# Patient Record
Sex: Male | Born: 1995 | Race: White | Hispanic: No | Marital: Single | Smoking: Current every day smoker
Health system: Southern US, Community
[De-identification: ages and names within clinical notes are randomized; demographics above are authoritative.]

## PROBLEM LIST (undated history)

## (undated) HISTORY — PX: TONSILLECTOMY: SUR1361

## (undated) HISTORY — PX: WISDOM TOOTH EXTRACTION: SHX21

---

## 2018-01-18 ENCOUNTER — Other Ambulatory Visit: Payer: Self-pay | Admitting: Internal Medicine

## 2018-01-18 ENCOUNTER — Ambulatory Visit
Admission: RE | Admit: 2018-01-18 | Discharge: 2018-01-18 | Disposition: A | Discharge status: Home / Self Care | Payer: BLUE CROSS/BLUE SHIELD | Source: Ambulatory Visit | Attending: Internal Medicine | Admitting: Internal Medicine

## 2018-01-18 DIAGNOSIS — R0789 Other chest pain: Secondary | ICD-10-CM

## 2018-06-07 ENCOUNTER — Ambulatory Visit (INDEPENDENT_AMBULATORY_CARE_PROVIDER_SITE_OTHER): Payer: BLUE CROSS/BLUE SHIELD | Admitting: Internal Medicine

## 2018-06-07 ENCOUNTER — Encounter: Payer: Self-pay | Admitting: Internal Medicine

## 2018-06-07 VITALS — BP 104/62 | HR 104 | Ht 68.0 in | Wt 120.0 lb

## 2018-06-07 DIAGNOSIS — R0609 Other forms of dyspnea: Secondary | ICD-10-CM | POA: Diagnosis not present

## 2018-06-07 NOTE — Progress Notes (Signed)
Jeremy Navarro, male    DOB: [birth year], 22 y.o.   MRN: [id number]   Brief patient profile:  22 yowm active Vaper  grew up in [place]  Started vaping 2017 able to run cross country in middle school thru HS competitively as recently as 2017 and after that no regular ex and noted indolent progressive doe x fall 2018 to point where sob x one flight of steps so referred to pulmonary clinic 06/07/2018 by Dr   Jeremy Navarro for doe    History of Present Illness  06/07/2018  Pulmonary/ 1st office eval/Jeremy Navarro  Chief Complaint  Patient presents with  . Pulmonary Consult    Referred by Dr. Erick Navarro. Pt c/o DOE for the past 6 months. He gets winded walking up a flight of stairs.   Dyspnea: indolent onset/ minimally progressive  Doe = MMRC1 = can walk nl pace, flat grade, can't hurry or go uphills or steps s sob   Cough: none Sleep: sleeps prone, no problem SABA use: none   No obvious day to day or daytime variability or assoc excess/ purulent sputum or mucus plugs or hemoptysis or cp or chest tightness, subjective wheeze or overt sinus or hb symptoms.   Sleeping as above  without nocturnal  or early am exacerbation  of respiratory  c/o's or need for noct saba. Also denies any obvious fluctuation of symptoms with weather or environmental changes or other aggravating or alleviating factors except as outlined above   No unusual exposure hx or h/o childhood pna/ asthma or knowledge of premature birth.  Current Allergies, Complete Past Medical History, Past Surgical History, Family History, and Social History were reviewed in [employer] record.  ROS  The following are not active complaints unless bolded Hoarseness, sore throat, dysphagia, dental problems, itching, sneezing,  nasal congestion or discharge of excess mucus or purulent secretions, ear ache,   fever, chills, sweats, unintended wt loss or wt gain, classically pleuritic or exertional cp,  orthopnea pnd or arm/hand  swelling  or leg swelling, presyncope, palpitations, abdominal pain, anorexia, nausea, vomiting, diarrhea  or change in bowel habits or change in bladder habits, change in stools or change in urine, dysuria, hematuria,  rash, arthralgias, visual complaints, headache, numbness, weakness or ataxia or problems with walking or coordination,  change in mood or  memory.              Objective:     BP 104/62 (BP Location: Left Arm, Cuff Size: Normal)   Pulse (!) 104   Ht 5\' 8"  (1.727 m)   Wt 120 lb (54.4 kg)   SpO2 100%   BMI 18.25 kg/m   SpO2: 100 % RA   amb wm nad/ slt sweaty palms and mild exophthalmos s lid lag   HEENT: nl dentition, turbinates bilaterally, and oropharynx. Nl external ear canals without cough reflex   NECK :  without JVD/Nodes/TM/ nl carotid upstrokes bilaterally   LUNGS: no acc muscle use,  Nl contour chest which is clear to A and P bilaterally without cough on insp or exp maneuvers   CV:  RRR  no s3 or murmur or increase in P2, and no edema   ABD:  soft and nontender with nl inspiratory excursion in the supine position. No bruits or organomegaly appreciated, bowel sounds nl  MS:  Nl gait/ ext warm without deformities, calf tenderness, cyanosis or clubbing No obvious joint restrictions   SKIN: warm and dry without lesions  NEURO:  alert, approp, nl sensorium with  no motor or cerebellar deficits apparent.      I personally reviewed images and agree with radiology impression as follows:  CXR:   01/18/18  Nl exam   Labs  05/26/18 in care everywhere:  HC03/ hgb/ tsh all nl     Assessment   DOE (dyspnea on exertion) Last able to do rigorous aerobics 2017 - Spirometry 06/07/2018  FEV1 2.7 (61%)  Ratio 78 with abn contour to effort dep portion of f/v loop only  - 06/07/2018   Walked RA  2 laps @ 26410ft each @ fast pace  stopped due to end of test/ no sob or desats - - FENO 06/07/2018  =   13    Symptoms are markedly disproportionate to objective  findings and not clear to what extent this is actually a pulmonary  problem but pt does appear to have difficult to sort out respiratory symptoms of unknown origin for which  DDX  = almost all start with A and  include Adherence, Ace Inhibitors, Acid Reflux, Active Sinus Disease, Alpha 1 Antitripsin deficiency, Anxiety masquerading as Airways dz,  ABPA,  Allergy(esp in young), Aspiration (esp in elderly), Adverse effects of meds,  Active smoking or Vaping, A bunch of PE's/clot burden (a few small clots can't cause this syndrome unless there is already severe underlying pulm or vascular dz with poor reserve),  Anemia or thyroid disorder, plus two Bs  = Bronchiectasis and Beta blocker use..and one C= CHF     ? Active smoking/ vaping > strongly rec continue to wean himself off all vapes   ? Anxiety/deconditioning  > usually at the bottom of this list of usual suspects but should be much higher on this pt's based on H and P and  Certainly may interfere with adherence and also interpretation of response or lack thereof to symptom management which can be quite subjective.   ? Allergy/ asthma unlikely with such a low feno and no variability or  cough or noct symtoms or assoc rhinitis but asthma has not been excluded at this point  ? Anemia/ thyroid dz > excluded 05/25/18    >>> rec gradually increase aerobic activity and return in 4 weeks with pfts and then arrange cpst (with spirometry before and after ) if not better and no dx to explain symptoms.     Total time devoted to counseling  > 50 % of initial 60 min office visit:  review case with pt/directly observe walking study/ reviewe spirometry and  discussion of options/alternatives/ personally creating written customized instructions  in presence of pt  then going over those specific  Instructions directly with the pt including how to use all of the meds but in particular covering each new medication in detail and the difference between the maintenance=  "automatic" meds and the prns using an action plan format for the latter (If this problem/symptom => do that organization reading Left to right).  Please see AVS from this visit for a full list of these instructions which I personally wrote for this pt and  are unique to this visit.      Sandrea HughsMichael Annalicia Renfrew, MD 06/07/2018

## 2018-06-07 NOTE — Patient Instructions (Addendum)
To get the most out of exercise, you need to be continuously aware that you are short of breath, but never out of breath, for 30 minutes daily. As you improve, it will actually be easier for you to do the same amount of exercise  in  30 minutes so always push to the level where you are short of breath.     Try to limit / wean all vaping    Please schedule a follow up office visit in 4 weeks, sooner if needed pfts and cxr

## 2018-06-08 ENCOUNTER — Encounter: Payer: Self-pay | Admitting: Internal Medicine

## 2018-06-08 DIAGNOSIS — R0609 Other forms of dyspnea: Principal | ICD-10-CM | POA: Insufficient documentation

## 2018-06-08 LAB — NITRIC OXIDE: Nitric Oxide: 13

## 2018-06-08 NOTE — Assessment & Plan Note (Addendum)
Last able to do rigorous aerobics 2017 - Spirometry 06/07/2018  FEV1 2.7 (61%)  Ratio 78 with abn contour to effort dep portion of f/v loop only  - 06/07/2018   Walked RA  2 laps @ 27910ft each @ fast pace  stopped due to end of test/ no sob or desats - - FENO 06/07/2018  =   13    Symptoms are markedly disproportionate to objective findings and not clear to what extent this is actually a pulmonary  problem but pt does appear to have difficult to sort out respiratory symptoms of unknown origin for which  DDX  = almost all start with A and  include Adherence, Ace Inhibitors, Acid Reflux, Active Sinus Disease, Alpha 1 Antitripsin deficiency, Anxiety masquerading as Airways dz,  ABPA,  Allergy(esp in young), Aspiration (esp in elderly), Adverse effects of meds,  Active smoking or Vaping, A bunch of PE's/clot burden (a few small clots can't cause this syndrome unless there is already severe underlying pulm or vascular dz with poor reserve),  Anemia or thyroid disorder, plus two Bs  = Bronchiectasis and Beta blocker use..and one C= CHF     ? Active smoking/ vaping > strongly rec continue to wean himself off all vapes   ? Anxiety/deconditioning  > usually at the bottom of this list of usual suspects but should be much higher on this pt's based on H and P and  Certainly may interfere with adherence and also interpretation of response or lack thereof to symptom management which can be quite subjective.   ? Allergy/ asthma unlikely with such a low feno and no variability or  cough or noct symtoms or assoc rhinitis but asthma has not been excluded at this point  ? Anemia/ thyroid dz > excluded 05/25/18    >>> rec gradually increase aerobic activity and return in 4 weeks with pfts and then arrange cpst (with spirometry before and after ) if not better and no dx to explain symptoms.     Total time devoted to counseling  > 50 % of initial 60 min office visit:  review case with pt/directly observe walking  study/ reviewe spirometry and  discussion of options/alternatives/ personally creating written customized instructions  in presence of pt  then going over those specific  Instructions directly with the pt including how to use all of the meds but in particular covering each new medication in detail and the difference between the maintenance= "automatic" meds and the prns using an action plan format for the latter (If this problem/symptom => do that organization reading Left to right).  Please see AVS from this visit for a full list of these instructions which I personally wrote for this pt and  are unique to this visit.

## 2018-06-09 ENCOUNTER — Encounter: Payer: Self-pay | Admitting: Internal Medicine

## 2018-07-04 ENCOUNTER — Other Ambulatory Visit: Payer: Self-pay | Admitting: Internal Medicine

## 2018-07-04 DIAGNOSIS — R0609 Other forms of dyspnea: Principal | ICD-10-CM

## 2018-07-05 ENCOUNTER — Ambulatory Visit: Payer: BLUE CROSS/BLUE SHIELD | Admitting: Internal Medicine

## 2018-07-18 ENCOUNTER — Emergency Department (HOSPITAL_BASED_OUTPATIENT_CLINIC_OR_DEPARTMENT_OTHER)
Admission: EM | Admit: 2018-07-18 | Discharge: 2018-07-19 | Disposition: A | Discharge status: Home / Self Care | Payer: BLUE CROSS/BLUE SHIELD | Attending: Emergency Medicine | Admitting: Emergency Medicine

## 2018-07-18 ENCOUNTER — Other Ambulatory Visit: Payer: Self-pay

## 2018-07-18 ENCOUNTER — Encounter (HOSPITAL_BASED_OUTPATIENT_CLINIC_OR_DEPARTMENT_OTHER): Payer: Self-pay | Admitting: *Deleted

## 2018-07-18 DIAGNOSIS — F17290 Nicotine dependence, other tobacco product, uncomplicated: Secondary | ICD-10-CM | POA: Diagnosis not present

## 2018-07-18 DIAGNOSIS — R51 Headache: Secondary | ICD-10-CM | POA: Insufficient documentation

## 2018-07-18 DIAGNOSIS — R519 Headache, unspecified: Secondary | ICD-10-CM

## 2018-07-18 NOTE — ED Triage Notes (Signed)
Headache, nausea and light sensitivity that started earlier today.

## 2018-07-18 NOTE — ED Notes (Signed)
ED Provider at bedside. 

## 2018-07-18 NOTE — ED Notes (Signed)
Headache almost completely resolved after being placed in dark treatment room.

## 2018-07-19 ENCOUNTER — Ambulatory Visit: Payer: BLUE CROSS/BLUE SHIELD | Admitting: Internal Medicine

## 2018-07-19 ENCOUNTER — Emergency Department (HOSPITAL_BASED_OUTPATIENT_CLINIC_OR_DEPARTMENT_OTHER): Payer: BLUE CROSS/BLUE SHIELD

## 2018-07-19 NOTE — ED Notes (Signed)
ED Provider at bedside. 

## 2018-07-19 NOTE — Discharge Instructions (Signed)
Ibuprofen 600 mg every 6 hours as needed if symptoms recur.  Follow-up with your primary doctor if symptoms persist or change.

## 2018-07-19 NOTE — ED Provider Notes (Signed)
[facility] EMERGENCY DEPARTMENT Provider Note   CSN: [id number] Arrival date & time: 07/18/18  2210     History   Chief Complaint Chief Complaint  Patient presents with  . Headache    HPI Jeremy Navarro is a 23 y.o. male.  Patient is a 23 year old male with no significant past medical history.  He presents today with complaints of headache.  This started earlier this evening.  He describes the sudden onset of severe pain to the right temple with associated nausea but no vomiting.  He denies any visual disturbances, numbness, or tingling.  He does report an episode where he fainted and struck his head approximately 3 weeks ago but has not experienced any symptoms since.  He denies any fevers, chills, or stiff neck.  He denies excessive caffeine or alcohol use.  He does report being in school and feeling somewhat stressed.  The history is provided by the patient.  Headache  Pain location:  R temporal Quality:  Sharp Radiates to:  Does not radiate Severity currently:  2/10 Severity at highest:  8/10 Onset quality:  Sudden Timing:  Constant Progression:  Improving Chronicity:  New Similar to prior headaches: no   Context: activity and bright light   Worsened by:  Activity and light Ineffective treatments:  NSAIDs   History reviewed. No pertinent past medical history.  Patient Active Problem List   Diagnosis Date Noted  . DOE (dyspnea on exertion) 06/08/2018    Past Surgical History:  Procedure Laterality Date  . TONSILLECTOMY    . WISDOM TOOTH EXTRACTION          Home Medications    Prior to Admission medications   Not on File    Family History No family history on file.  Social History Social History   Tobacco Use  . Smoking status: Current Every Day Smoker    Types: E-cigarettes  . Smokeless tobacco: Never Used  Substance Use Topics  . Alcohol use: Not on file  . Drug use: Not on file     Allergies   Patient has no known  allergies.   Review of Systems Review of Systems  Neurological: Positive for headaches.     Physical Exam Updated Vital Signs BP 127/78 (BP Location: Right Arm)   Pulse 75   Temp 98 F (36.7 C) (Oral)   Resp 16   Ht 5' 8.75" (1.746 m)   Wt 54.4 kg   SpO2 100%   BMI 17.85 kg/m   Physical Exam Vitals signs and nursing note reviewed.  Constitutional:      General: He is not in acute distress.    Appearance: He is well-developed. He is not diaphoretic.  HENT:     Head: Normocephalic and atraumatic.  Eyes:     General: No visual field deficit.    Extraocular Movements: Extraocular movements intact.     Pupils: Pupils are equal, round, and reactive to light.  Neck:     Musculoskeletal: Normal range of motion and neck supple.  Cardiovascular:     Rate and Rhythm: Normal rate and regular rhythm.     Heart sounds: No murmur. No friction rub.  Pulmonary:     Effort: Pulmonary effort is normal. No respiratory distress.     Breath sounds: Normal breath sounds. No wheezing or rales.  Abdominal:     General: Bowel sounds are normal. There is no distension.     Palpations: Abdomen is soft.     Tenderness: There  is no abdominal tenderness.  Musculoskeletal: Normal range of motion.  Skin:    General: Skin is warm and dry.  Neurological:     Mental Status: He is alert and oriented to person, place, and time.     Cranial Nerves: No cranial nerve deficit or facial asymmetry.     Sensory: No sensory deficit.     Coordination: Coordination normal.      ED Treatments / Results  Labs (all labs ordered are listed, but only abnormal results are displayed) Labs Reviewed - No data to display  EKG None  Radiology No results found.  Procedures Procedures (including critical care time)  Medications Ordered in ED Medications - No data to display   Initial Impression / Assessment and Plan / ED Course  I have reviewed the triage vital signs and the nursing  notes.  Pertinent labs & imaging results that were available during my care of the patient were reviewed by me and considered in my medical decision making (see chart for details).  Patient presents with complaints of headache that began this afternoon.  He describes severe pain to the right side of his head with associated nausea, but no vomiting.  His presentation sounds much like a cluster headache.  He does report he has been under a lot of stress with school.  His neurologic exam is nonfocal and CT scan is negative.  He is feeling better after being placed in the dark room and without receiving any medications.  He will be discharged and is to follow-up as needed.  Final Clinical Impressions(s) / ED Diagnoses   Final diagnoses:  None    ED Discharge Orders    None       Geoffery Lyons, MD 07/19/18 0104

## 2018-08-15 ENCOUNTER — Encounter (HOSPITAL_COMMUNITY): Payer: Self-pay | Admitting: Emergency Medicine

## 2018-08-15 ENCOUNTER — Emergency Department (HOSPITAL_COMMUNITY)
Admission: EM | Admit: 2018-08-15 | Discharge: 2018-08-15 | Disposition: A | Discharge status: Home / Self Care | Payer: BLUE CROSS/BLUE SHIELD | Attending: Emergency Medicine | Admitting: Emergency Medicine

## 2018-08-15 ENCOUNTER — Emergency Department (HOSPITAL_COMMUNITY): Payer: BLUE CROSS/BLUE SHIELD

## 2018-08-15 ENCOUNTER — Ambulatory Visit (INDEPENDENT_AMBULATORY_CARE_PROVIDER_SITE_OTHER)
Admission: EM | Admit: 2018-08-15 | Discharge: 2018-08-15 | Disposition: A | Discharge status: Home / Self Care | Payer: BLUE CROSS/BLUE SHIELD | Source: Home / Self Care | Attending: Family Medicine | Admitting: Family Medicine

## 2018-08-15 ENCOUNTER — Other Ambulatory Visit: Payer: Self-pay

## 2018-08-15 DIAGNOSIS — N201 Calculus of ureter: Secondary | ICD-10-CM | POA: Diagnosis not present

## 2018-08-15 DIAGNOSIS — R1031 Right lower quadrant pain: Secondary | ICD-10-CM

## 2018-08-15 DIAGNOSIS — R109 Unspecified abdominal pain: Secondary | ICD-10-CM

## 2018-08-15 DIAGNOSIS — F17210 Nicotine dependence, cigarettes, uncomplicated: Secondary | ICD-10-CM | POA: Insufficient documentation

## 2018-08-15 LAB — CBC
HCT: 46.6 % (ref 39.0–52.0)
Hemoglobin: 15.4 g/dL (ref 13.0–17.0)
MCH: 30.3 pg (ref 26.0–34.0)
MCHC: 33 g/dL (ref 30.0–36.0)
MCV: 91.6 fL (ref 80.0–100.0)
Platelets: 242 10*3/uL (ref 150–400)
RBC: 5.09 MIL/uL (ref 4.22–5.81)
RDW: 12.4 % (ref 11.5–15.5)
WBC: 6.9 10*3/uL (ref 4.0–10.5)
nRBC: 0 % (ref 0.0–0.2)

## 2018-08-15 LAB — COMPREHENSIVE METABOLIC PANEL
ALT: 13 U/L (ref 0–44)
ANION GAP: 8 (ref 5–15)
AST: 19 U/L (ref 15–41)
Albumin: 4.3 g/dL (ref 3.5–5.0)
Alkaline Phosphatase: 58 U/L (ref 38–126)
BUN: 14 mg/dL (ref 6–20)
CO2: 25 mmol/L (ref 22–32)
Calcium: 9.4 mg/dL (ref 8.9–10.3)
Chloride: 105 mmol/L (ref 98–111)
Creatinine, Ser: 0.95 mg/dL (ref 0.61–1.24)
GFR calc Af Amer: >60 mL/min (ref >60)
GFR calc non Af Amer: >60 mL/min (ref >60)
GLUCOSE: 108 mg/dL — AB (ref 70–99)
Potassium: 4 mmol/L (ref 3.5–5.1)
Sodium: 138 mmol/L (ref 135–145)
Total Bilirubin: 0.6 mg/dL (ref 0.3–1.2)
Total Protein: 6.4 g/dL — ABNORMAL LOW (ref 6.5–8.1)

## 2018-08-15 LAB — URINALYSIS, ROUTINE W REFLEX MICROSCOPIC
Bilirubin Urine: NEGATIVE
Glucose, UA: NEGATIVE mg/dL
HGB URINE DIPSTICK: NEGATIVE
Ketones, ur: NEGATIVE mg/dL
Leukocytes,Ua: NEGATIVE
Nitrite: NEGATIVE
Protein, ur: NEGATIVE mg/dL
Specific Gravity, Urine: 1.026 (ref 1.005–1.030)
pH: 6 (ref 5.0–8.0)

## 2018-08-15 LAB — LIPASE, BLOOD: Lipase: 19 U/L (ref 11–51)

## 2018-08-15 MED ORDER — KETOROLAC TROMETHAMINE 60 MG/2ML IM SOLN
60.0000 mg | Freq: Once | INTRAMUSCULAR | Status: AC
  Administered 2018-08-15: 60 mg via INTRAMUSCULAR

## 2018-08-15 MED ORDER — HYDROCODONE-ACETAMINOPHEN 5-325 MG PO TABS: 1.0000 | ORAL_TABLET | Freq: Four times a day (QID) | ORAL | 0 refills | Status: AC | PRN

## 2018-08-15 MED ORDER — SODIUM CHLORIDE 0.9 % IV BOLUS
1000.0000 mL | Freq: Once | INTRAVENOUS | Status: AC
  Administered 2018-08-15: 1000 mL via INTRAVENOUS

## 2018-08-15 MED ORDER — ONDANSETRON 4 MG PO TBDP
4.0000 mg | ORAL_TABLET | Freq: Once | ORAL | Status: AC
  Administered 2018-08-15: 4 mg via ORAL

## 2018-08-15 MED ORDER — ONDANSETRON 4 MG PO TBDP
ORAL_TABLET | ORAL | Status: AC
  Filled 2018-08-15: qty 1

## 2018-08-15 MED ORDER — HYDROMORPHONE HCL 1 MG/ML IJ SOLN
0.5000 mg | Freq: Once | INTRAMUSCULAR | Status: AC
  Administered 2018-08-15: 0.5 mg via INTRAVENOUS
  Filled 2018-08-15: qty 1

## 2018-08-15 MED ORDER — ONDANSETRON HCL 4 MG/2ML IJ SOLN
4.0000 mg | Freq: Once | INTRAMUSCULAR | Status: AC
  Administered 2018-08-15: 4 mg via INTRAVENOUS
  Filled 2018-08-15: qty 2

## 2018-08-15 MED ORDER — SODIUM CHLORIDE 0.9 % IV SOLN: INTRAVENOUS | Status: DC

## 2018-08-15 MED ORDER — KETOROLAC TROMETHAMINE 60 MG/2ML IM SOLN
INTRAMUSCULAR | Status: AC
  Filled 2018-08-15: qty 2

## 2018-08-15 NOTE — ED Triage Notes (Signed)
Pt. Stated, I woke up this morning with rt. Side pain radiating to my testicular area. I was at [facility] and they sent me here for further evaluation. Given Toradol and Zofran.

## 2018-08-15 NOTE — ED Provider Notes (Signed)
[facility]   [id number] 08/15/18 Arrival Time: 0840  CC: ABDOMINAL pain  SUBJECTIVE:  ZARIAH BARIS is a 23 y.o. male who presents with complaint of abrupt abdominal pain that began this morning.  Denies a precipitating event, or trauma.  Localizes pain to RLQ and RT flank. Radiates towards RT groin. Describes as constant, worsening and "100"/10.  Has not tried OTC medications.  Worse with movement  Denies similar symptoms in the past.  Complains of associated nausea.    Denies fever, chills, vomiting, chest pain, SOB, diarrhea, constipation, hematochezia, melena, dysuria, difficulty urinating, increased frequency or urgency, flank pain, loss of bowel or bladder function, testicular swelling.    No LMP for male patient.  ROS: As per HPI.  History reviewed. No pertinent past medical history. Past Surgical History:  Procedure Laterality Date  . TONSILLECTOMY    . WISDOM TOOTH EXTRACTION     No Known Allergies No current facility-administered medications on file prior to encounter.    No current outpatient medications on file prior to encounter.   Social History   Socioeconomic History  . Marital status: Single    Spouse name: Not on file  . Number of children: Not on file  . Years of education: Not on file  . Highest education level: Not on file  Occupational History  . Not on file  Social Needs  . Financial resource strain: Not on file  . Food insecurity:    Worry: Not on file    Inability: Not on file  . Transportation needs:    Medical: Not on file    Non-medical: Not on file  Tobacco Use  . Smoking status: Current Every Day Smoker    Types: E-cigarettes  . Smokeless tobacco: Never Used  Substance and Sexual Activity  . Alcohol use: Not on file  . Drug use: Not on file  . Sexual activity: Not on file  Lifestyle  . Physical activity:    Days per week: Not on file    Minutes per session: Not on file  . Stress: Not on file  Relationships  .  Social connections:    Talks on phone: Not on file    Gets together: Not on file    Attends religious service: Not on file    Active member of club or organization: Not on file    Attends meetings of clubs or organizations: Not on file    Relationship status: Not on file  . Intimate partner violence:    Fear of current or ex partner: Not on file    Emotionally abused: Not on file    Physically abused: Not on file    Forced sexual activity: Not on file  Other Topics Concern  . Not on file  Social History Narrative  . Not on file   History reviewed. No pertinent family history.   OBJECTIVE:  Vitals:   08/15/18 0855  BP: 113/64  Pulse: 93  Resp: (!) 22  Temp: (!) 97.5 F (36.4 C)  TempSrc: Oral  SpO2: 100%    General appearance: Alert; appears uncomfortable laying on bed holding RLQ and RT flank HEENT: NCAT.  Oropharynx clear.  Lungs: clear to auscultation bilaterally without adventitious breath sounds Heart: regular rate and rhythm.  Radial pulses 2+ symmetrical bilaterally Abdomen: exam limited given patient discomfort: soft, non-distended; normal active bowel sounds; TTP over RLQ; + guarding, tearful throughout examination Extremities: no edema; symmetrical with no gross deformities Skin: warm and dry Neurologic: normal  gait Psychological: alert and cooperative; normal mood and affect  ASSESSMENT & PLAN:  1. Right lower quadrant pain   2. Right flank pain     Meds ordered this encounter  Medications  . ketorolac (TORADOL) injection 60 mg  . ondansetron (ZOFRAN-ODT) disintegrating tablet 4 mg   Recommending further evaluation and management in the ED for RLQ and RT flank pain Given toradol and zofran in office Patient and mother aware and in agreement with plan.   Taken by vehicle to ED    Rennis Harding, [place] 08/15/18 [phone]

## 2018-08-15 NOTE — ED Notes (Signed)
Patient is nauseated

## 2018-08-15 NOTE — ED Notes (Signed)
Mom has arrived and ready to take patient to ed.

## 2018-08-15 NOTE — ED Provider Notes (Signed)
[facility] EMERGENCY DEPARTMENT Provider Note   CSN: [id number] Arrival date & time: 08/15/18  7026    History   Chief Complaint Chief Complaint  Patient presents with  . Abdominal Pain    HPI Jeremy Navarro is a 23 y.o. male.     Patient with acute onset of right lower quadrant abdominal pain about 730 this morning.  Patient went to urgent care and then referred over here.  The pain is persisted in the right lower quadrant does have some pain that radiates into the testicular area.  Patient is never had anything like this before.  He went to urgent care they gave him Toradol and Zofran.  Sent here for further evaluation.  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  Patient not followed by urology here.  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  Denies any blood in the urine no difficulty urinating.     History reviewed. No pertinent past medical history.  Patient Active Problem List   Diagnosis Date Noted  . DOE (dyspnea on exertion) 06/08/2018    Past Surgical History:  Procedure Laterality Date  . TONSILLECTOMY    . WISDOM TOOTH EXTRACTION          Home Medications    Prior to Admission medications   Medication Sig Start Date End Date Taking? Authorizing Provider  HYDROcodone-acetaminophen (NORCO/VICODIN) 5-325 MG tablet Take 1 tablet by mouth every 6 (six) hours as needed for moderate pain. 08/15/18   Vanetta Mulders, MD    Family History No family history on file.  Social History Social History   Tobacco Use  . Smoking status: Current Every Day Smoker    Types: E-cigarettes  . Smokeless tobacco: Never Used  Substance Use Topics  . Alcohol use: Not on file  . Drug use: Not on file     Allergies   Patient has no known allergies.   Review of Systems Review of Systems    Constitutional: Negative for chills and fever.  HENT: Negative for rhinorrhea and sore throat.   Eyes: Negative for visual disturbance.  Respiratory: Negative for cough and shortness of breath.   Cardiovascular: Negative for chest pain and leg swelling.  Gastrointestinal: Positive for abdominal pain. Negative for diarrhea, nausea and vomiting.  Genitourinary: Negative for dysuria.  Musculoskeletal: Negative for back pain and neck pain.  Skin: Negative for rash.  Neurological: Negative for dizziness, light-headedness and headaches.  Hematological: Does not bruise/bleed easily.  Psychiatric/Behavioral: Negative for confusion.     Physical Exam Updated Vital Signs BP 106/70   Pulse 71   Temp (!) 97.5 F (36.4 C) (Oral)   Resp 18   SpO2 98%   Physical Exam Vitals signs and nursing note reviewed.  Constitutional:      Appearance: He is well-developed.  HENT:     Head: Normocephalic and atraumatic.     Mouth/Throat:     Mouth: Mucous membranes are moist.  Eyes:     Conjunctiva/sclera: Conjunctivae normal.     Pupils: Pupils are equal, round, and reactive to light.  Neck:     Musculoskeletal: Neck supple.  Cardiovascular:     Rate and Rhythm: Normal rate and regular rhythm.     Heart sounds: Normal heart sounds. No murmur.  Pulmonary:     Effort: Pulmonary effort is normal.  No respiratory distress.     Breath sounds: Normal breath sounds.  Abdominal:     General: Bowel sounds are normal.     Palpations: Abdomen is soft.     Tenderness: There is no abdominal tenderness.     Comments: Abdomen nontender no guarding  Musculoskeletal: Normal range of motion.  Skin:    General: Skin is warm and dry.  Neurological:     General: No focal deficit present.     Mental Status: He is alert and oriented to person, place, and time.      ED Treatments / Results  Labs (all labs ordered are listed, but only abnormal results are displayed) Labs Reviewed  COMPREHENSIVE METABOLIC  PANEL - Abnormal; Notable for the following components:      Result Value   Glucose, Bld 108 (*)    Total Protein 6.4 (*)    All other components within normal limits  LIPASE, BLOOD  CBC  URINALYSIS, ROUTINE W REFLEX MICROSCOPIC    EKG None  Radiology Ct Renal Stone Study  Result Date: 08/15/2018 CLINICAL DATA:  Right flank region pain EXAM: CT ABDOMEN AND PELVIS WITHOUT CONTRAST TECHNIQUE: Multidetector CT imaging of the abdomen and pelvis was performed following the standard protocol without oral or IV contrast. COMPARISON:  None. FINDINGS: Lower chest: Lung bases are clear. Hepatobiliary: No focal liver lesions are appreciable on this noncontrast enhanced study. The gallbladder wall is not appreciably thickened. There is no biliary duct dilatation. Pancreas: No pancreatic mass or inflammatory focus is appreciable. Spleen: No splenic lesions are appreciable. Adrenals/Urinary Tract: Adrenals appear normal bilaterally. There is a cyst arising from the upper pole the right kidney measuring 2.3 x 2.5 cm. There is hydronephrosis, moderate, on the right. There is no hydronephrosis on the left. There is minimal nephrocalcinosis bilaterally. There is no well-defined intrarenal calculus on either side. There is no appreciable ureteral calculus on either side. Urinary bladder wall thickness is normal. Urinary bladder is midline in location. There is a calculus within the posterior aspect of the urinary bladder slightly to the left of midline measuring 5 x 4 mm. An apparent second calculus more laterally located on the left in the posterior bladder measures 4 x 3 mm. Stomach/Bowel: There is no appreciable bowel wall or mesenteric thickening. There is moderate stool in the colon. There is no evident bowel obstruction. There is no free air or portal venous air. Vascular/Lymphatic: There is no abdominal aortic aneurysm. There are no vascular lesions evident on this noncontrast enhanced study. There is no  adenopathy in the abdomen or pelvis. Reproductive: Prostate and seminal vesicles appear normal in size and contour. No evident pelvic mass. Other: Appendix appears unremarkable. No abscess or ascites evident in the abdomen or pelvis. Musculoskeletal: No blastic or lytic bone lesions. No intramuscular or abdominal wall lesion. IMPRESSION: Comment: Note that a paucity of fat makes this study less than optimal. 1. There is moderate hydronephrosis on the right. No hydronephrosis noted on the left. There is a calculus within the urinary bladder slightly to the left of midline measuring 5 x 4 mm. A second apparent calculus in the posterior urinary bladder measures 4 x 3 mm. Question recent calculus passage from the right side into the bladder. A well-defined calculus within the right ureter is not delineated on this study. It should be noted that pyelonephritis could present in this manner on the right and must be a differential consideration. Appropriate laboratory correlation advised. 2. No evident bowel obstruction. No  abscess in the abdomen or pelvis. Appendix appears normal. Electronically Signed   By: Bretta Bang III M.D.   On: 08/15/2018 13:58    Procedures Procedures (including critical care time)  Medications Ordered in ED Medications  0.9 %  sodium chloride infusion (has no administration in time range)  sodium chloride 0.9 % bolus 1,000 mL (0 mLs Intravenous Stopped 08/15/18 1149)  ondansetron (ZOFRAN) injection 4 mg (4 mg Intravenous Given 08/15/18 1048)  HYDROmorphone (DILAUDID) injection 0.5 mg (0.5 mg Intravenous Given 08/15/18 1050)  HYDROmorphone (DILAUDID) injection 0.5 mg (0.5 mg Intravenous Given 08/15/18 1357)     Initial Impression / Assessment and Plan / ED Course  I have reviewed the triage vital signs and the nursing notes.  Pertinent labs & imaging results that were available during my care of the patient were reviewed by me and considered in my medical decision making (see  chart for details).        Urinalysis negative no signs of infection.  Kidney function normal.  Patient CT scan consistent with 2 ureteral stones one appears to already be in the bladder the other one seems to be at the junction of the bladder.  Patient now feeling much better suspect that he is passed both of them.  We will give patient referral to [facility] for [facility]-up.  We will give him a short course of hydrocodone in case the pain recurs.  Not clear whether he can have anti-inflammatory medicine like Motrin or Naprosyn due to his past renal cyst.  Mother thought maybe he was to avoid that.  Overall patient feeling much better.  Patient given a school note to return to school on Thursday.  Patient will return for any new or worse symptoms.  As it turns out patient's mother has had a history of kidney stones.  Final Clinical Impressions(s) / ED Diagnoses   Final diagnoses:  Ureteral calculus, right    ED Discharge Orders         Ordered    HYDROcodone-acetaminophen (NORCO/VICODIN) 5-325 MG tablet  Every 6 hours PRN     08/15/18 1501           Vanetta Mulders, MD 08/15/18 [phone]

## 2018-08-15 NOTE — ED Triage Notes (Addendum)
Right abdominal pain that started suddenly at 7:30 this morning.  Unable to take a deep breath, causes stabbing pain in right abdomen.  describes pain radiating into right lower abdomen and groin area.  No issues urinating.  Says he has "cysts" in both kidneys.    [place], [place] notified of patient/ Dr Tracie Harrier notified

## 2018-08-15 NOTE — Discharge Instructions (Addendum)
Recommending further evaluation and management in the ED for RLQ and RT flank pain Given toradol and zofran in office Patient and mother aware and in agreement with plan.   Taken by vehicle to ED

## 2018-08-15 NOTE — Discharge Instructions (Signed)
Make an appointment to follow-up with urology.  Information provided above.  Take the hydrocodone as needed for pain.  School note provided.  Return for any new or worse symptoms.  As we discussed CT scan consistent with 2 right-sided kidney stones it appears that she probably passed them.

## 2018-08-15 NOTE — ED Notes (Signed)
Notified provider staff patient is in extreme pain and asking repeatedly for pain medicine.

## 2019-07-06 ENCOUNTER — Emergency Department (HOSPITAL_COMMUNITY): Payer: BC Managed Care – PPO

## 2019-07-06 ENCOUNTER — Emergency Department (HOSPITAL_COMMUNITY)
Admission: EM | Admit: 2019-07-06 | Discharge: 2019-07-07 | Disposition: A | Discharge status: Home / Self Care | Payer: BC Managed Care – PPO | Attending: Emergency Medicine | Admitting: Emergency Medicine

## 2019-07-06 ENCOUNTER — Other Ambulatory Visit: Payer: Self-pay

## 2019-07-06 ENCOUNTER — Encounter (HOSPITAL_COMMUNITY): Payer: Self-pay | Admitting: Emergency Medicine

## 2019-07-06 DIAGNOSIS — R0789 Other chest pain: Secondary | ICD-10-CM | POA: Insufficient documentation

## 2019-07-06 DIAGNOSIS — Z5321 Procedure and treatment not carried out due to patient leaving prior to being seen by health care provider: Secondary | ICD-10-CM | POA: Diagnosis not present

## 2019-07-06 LAB — CBC
HCT: 43.3 % (ref 39.0–52.0)
Hemoglobin: 14.6 g/dL (ref 13.0–17.0)
MCH: 30.1 pg (ref 26.0–34.0)
MCHC: 33.7 g/dL (ref 30.0–36.0)
MCV: 89.3 fL (ref 80.0–100.0)
Platelets: 301 10*3/uL (ref 150–400)
RBC: 4.85 MIL/uL (ref 4.22–5.81)
RDW: 12.2 % (ref 11.5–15.5)
WBC: 7.1 10*3/uL (ref 4.0–10.5)
nRBC: 0 % (ref 0.0–0.2)

## 2019-07-06 MED ORDER — SODIUM CHLORIDE 0.9% FLUSH: 3.0000 mL | Freq: Once | INTRAVENOUS | Status: DC

## 2019-07-06 NOTE — ED Triage Notes (Signed)
Pt c/o central cp 6/10 for the past 3 days getting worse today, pt denies SOB, nausea or vomiting no change on taste or smell.

## 2019-07-07 LAB — BASIC METABOLIC PANEL
Anion gap: 10 (ref 5–15)
BUN: 12 mg/dL (ref 6–20)
CO2: 24 mmol/L (ref 22–32)
Calcium: 9.6 mg/dL (ref 8.9–10.3)
Chloride: 105 mmol/L (ref 98–111)
Creatinine, Ser: 0.68 mg/dL (ref 0.61–1.24)
GFR calc Af Amer: >60 mL/min (ref >60)
GFR calc non Af Amer: >60 mL/min (ref >60)
Glucose, Bld: 93 mg/dL (ref 70–99)
Potassium: 3.9 mmol/L (ref 3.5–5.1)
Sodium: 139 mmol/L (ref 135–145)

## 2019-07-07 LAB — TROPONIN I (HIGH SENSITIVITY): Troponin I (High Sensitivity): <2 ng/L (ref <18)

## 2019-07-07 NOTE — ED Notes (Signed)
Pt stated he is leaving AMA. Pt said he would look at MyChart and return if symptoms worsen.

## 2020-08-12 ENCOUNTER — Ambulatory Visit (HOSPITAL_BASED_OUTPATIENT_CLINIC_OR_DEPARTMENT_OTHER)
Admission: RE | Admit: 2020-08-12 | Discharge: 2020-08-12 | Disposition: A | Discharge status: Home / Self Care | Payer: BC Managed Care – PPO | Source: Ambulatory Visit | Attending: Internal Medicine | Admitting: Internal Medicine

## 2020-08-12 ENCOUNTER — Other Ambulatory Visit: Payer: Self-pay

## 2020-08-12 ENCOUNTER — Other Ambulatory Visit (HOSPITAL_BASED_OUTPATIENT_CLINIC_OR_DEPARTMENT_OTHER): Payer: Self-pay | Admitting: Emergency Medicine

## 2020-08-12 DIAGNOSIS — S0992XA Unspecified injury of nose, initial encounter: Secondary | ICD-10-CM | POA: Insufficient documentation

## 2020-11-19 IMAGING — DX DG CHEST 2V
2 series · 2 of 2 positions shown · non-contrast
Comparison: Radiograph 01/18/2018

CLINICAL DATA: Central chest pain.

EXAM:
CHEST - 2 VIEW

[chest pa]
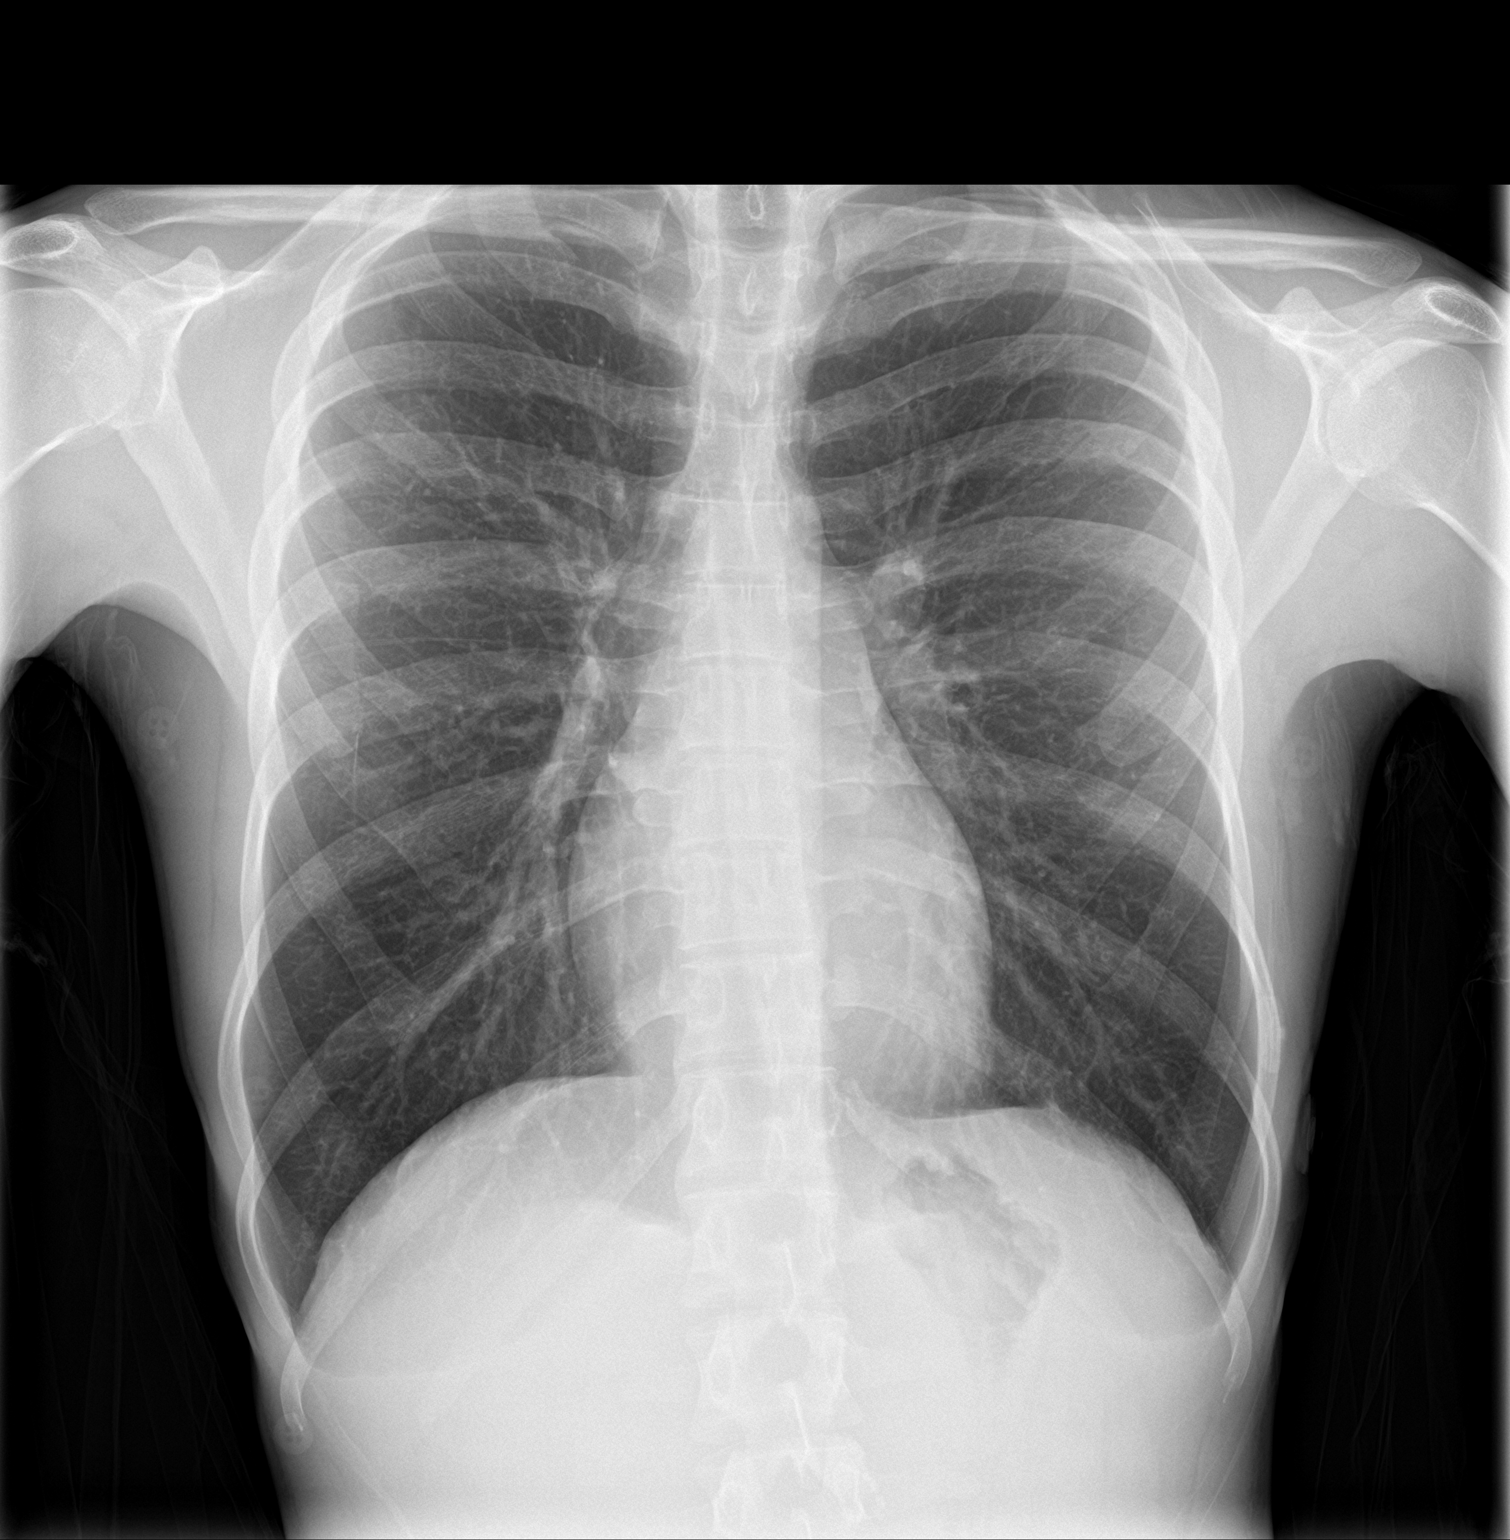

[chest lat]
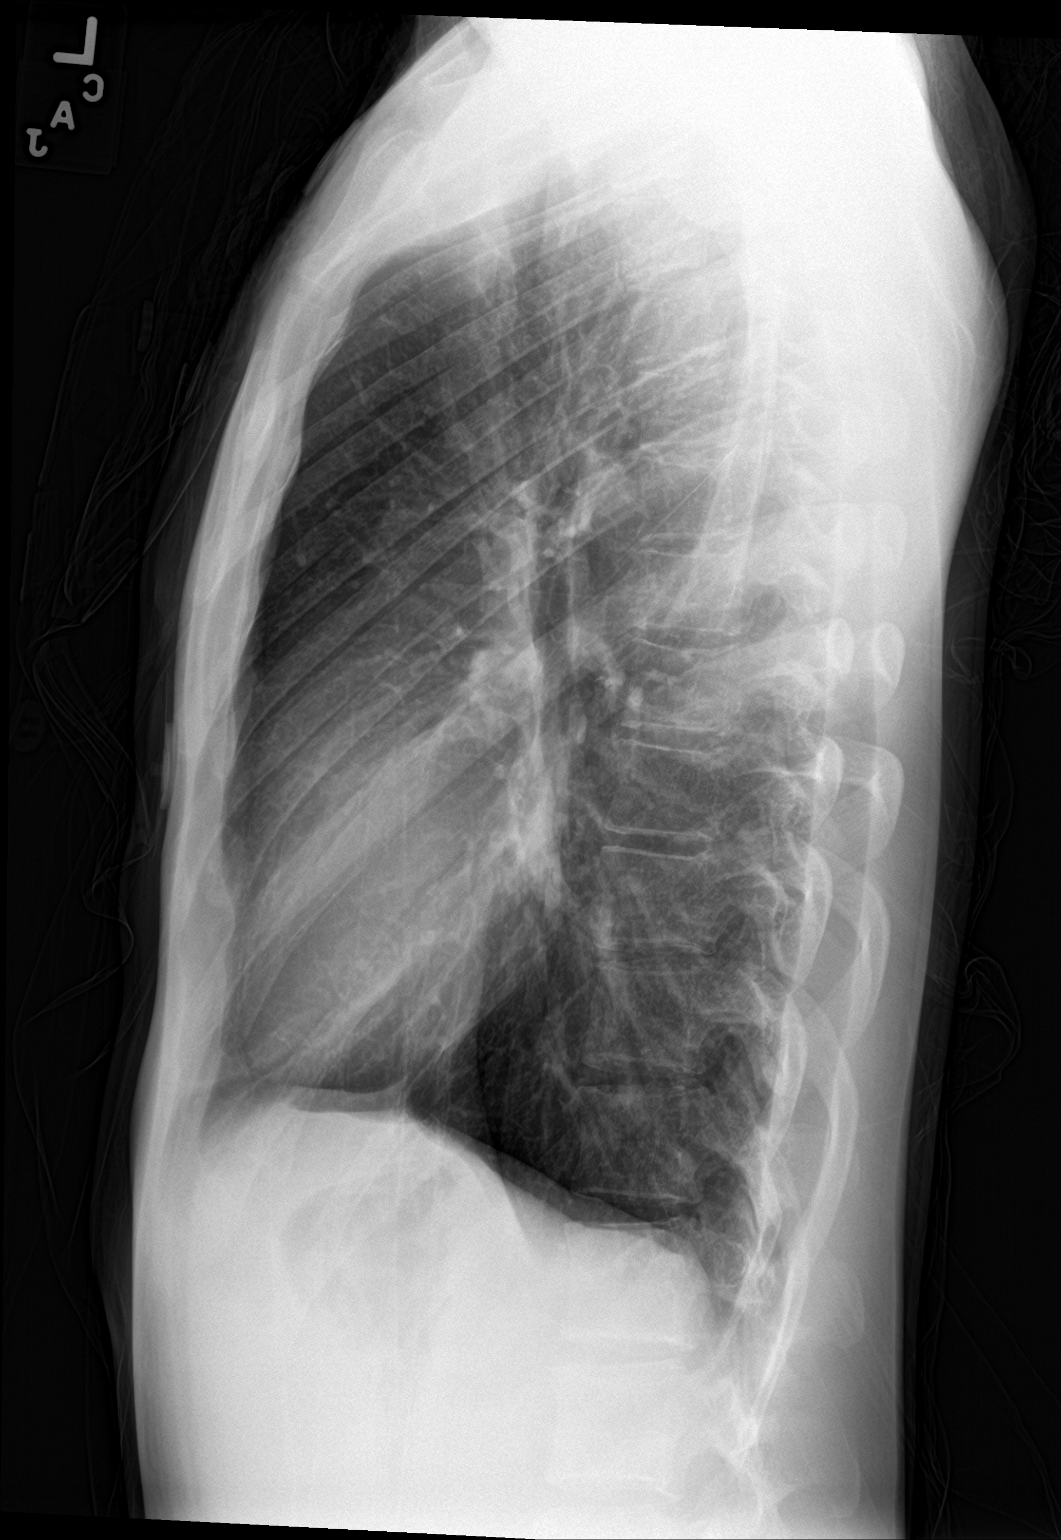

[2 of 2 positions shown; findings below may reference images not displayed]

FINDINGS: The cardiomediastinal contours are normal. The lungs are clear.
Pulmonary vasculature is normal. No consolidation, pleural effusion,
or pneumothorax. No acute osseous abnormalities are seen.
IMPRESSION: Negative radiographs of the chest.

## 2021-12-27 IMAGING — CR DG NASAL BONES 3+V
3 series · 3 of 3 positions shown · non-contrast
Comparison: None.

CLINICAL DATA: Status post trauma with left-sided nasal bone pain.

EXAM:
NASAL BONES - 3+ VIEW

[w waters *]
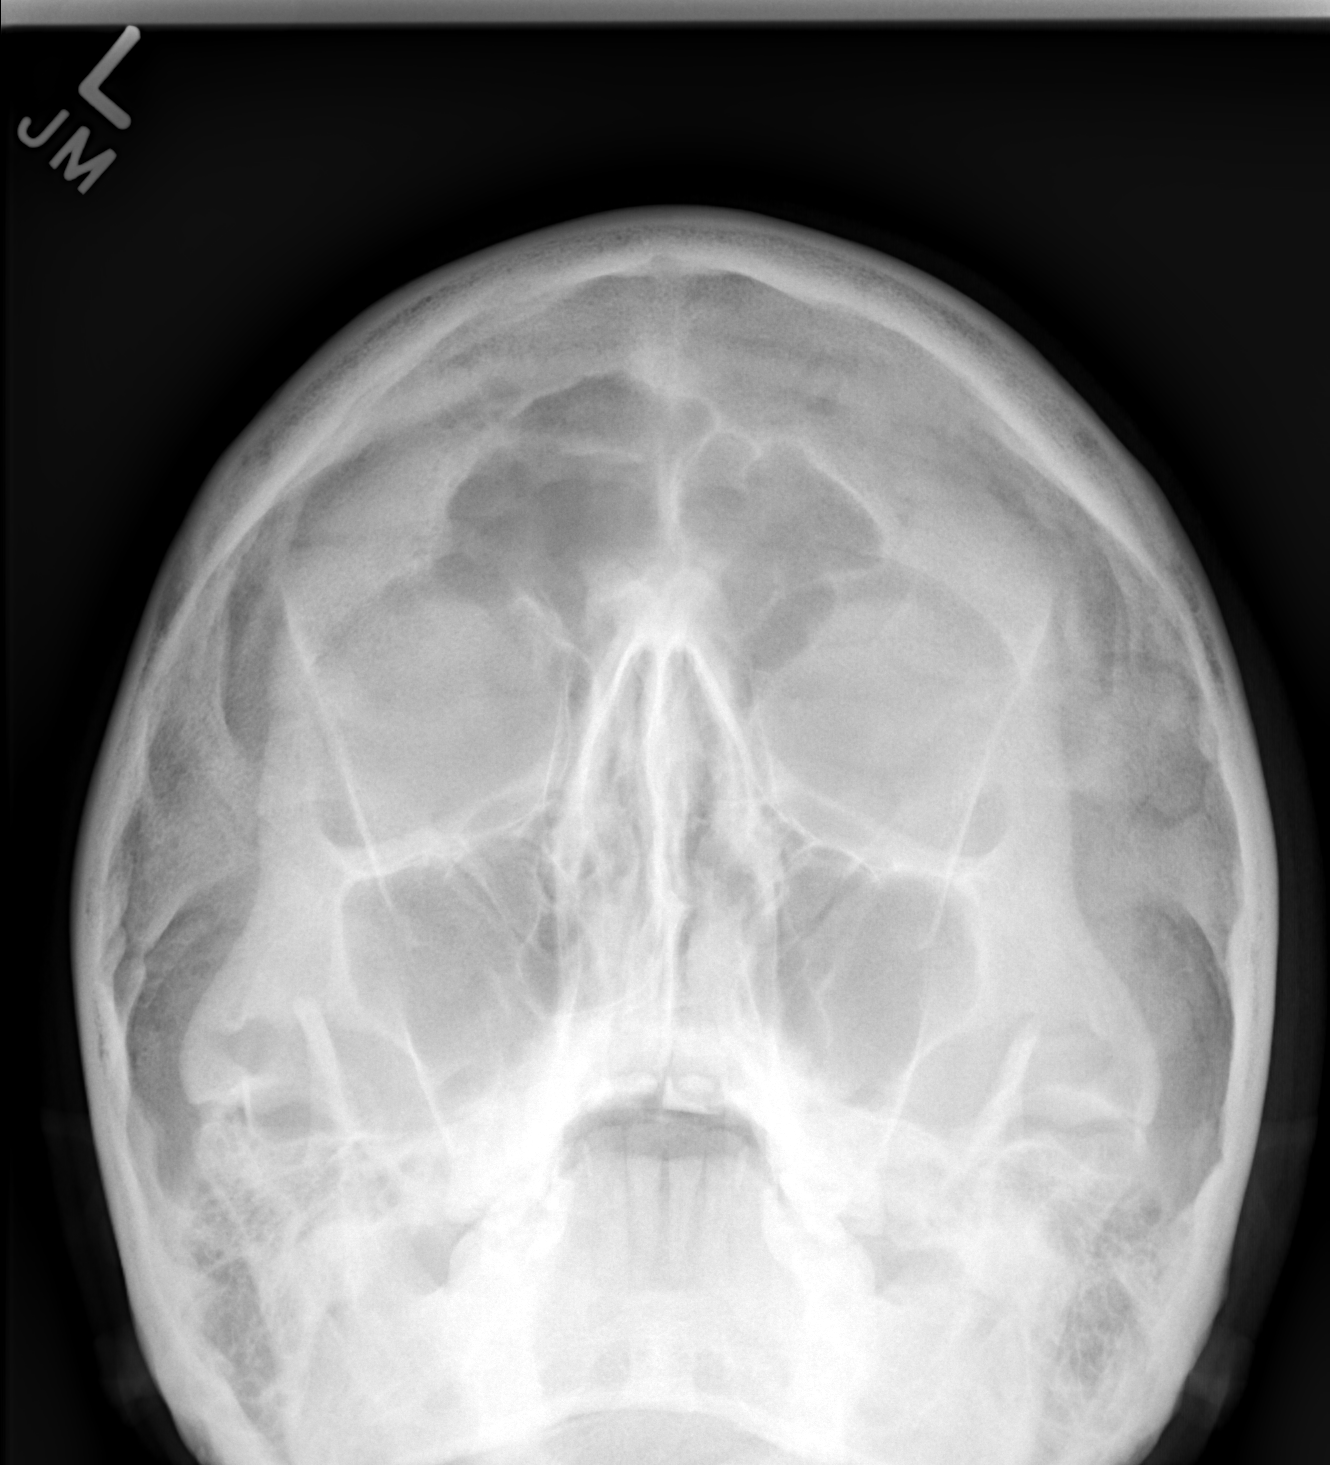

[w nasal bone lat * (1 of 2)]
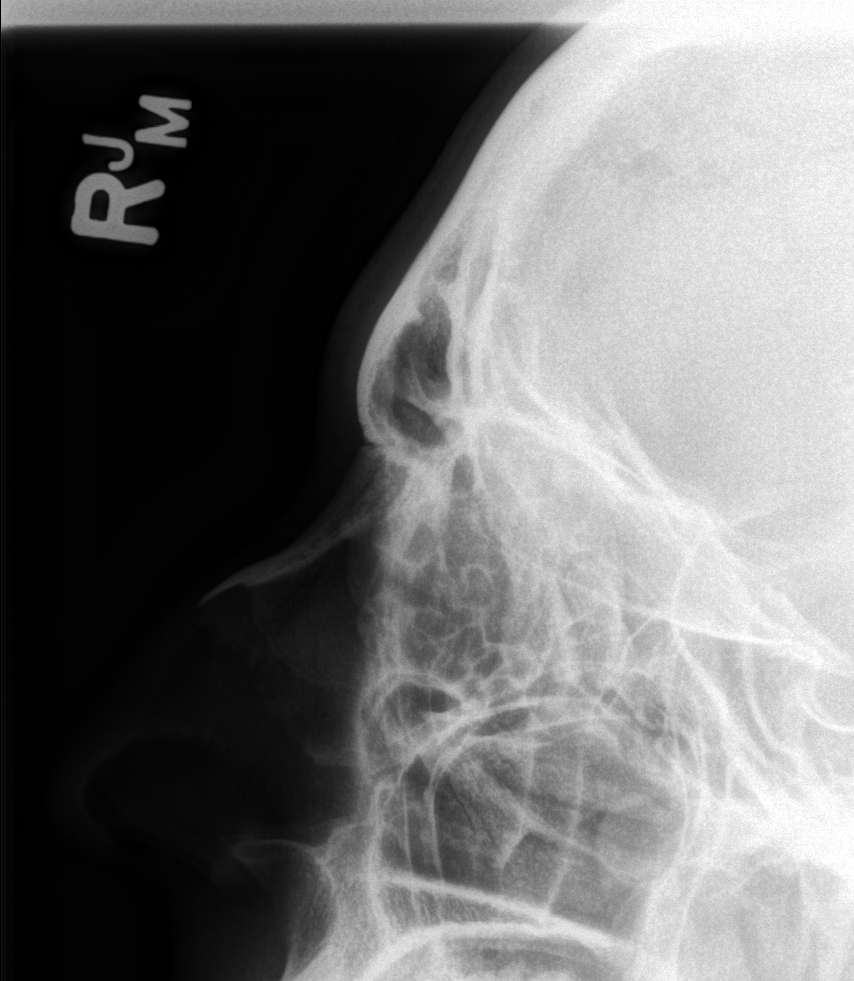

[w nasal bone lat * (2 of 2)]
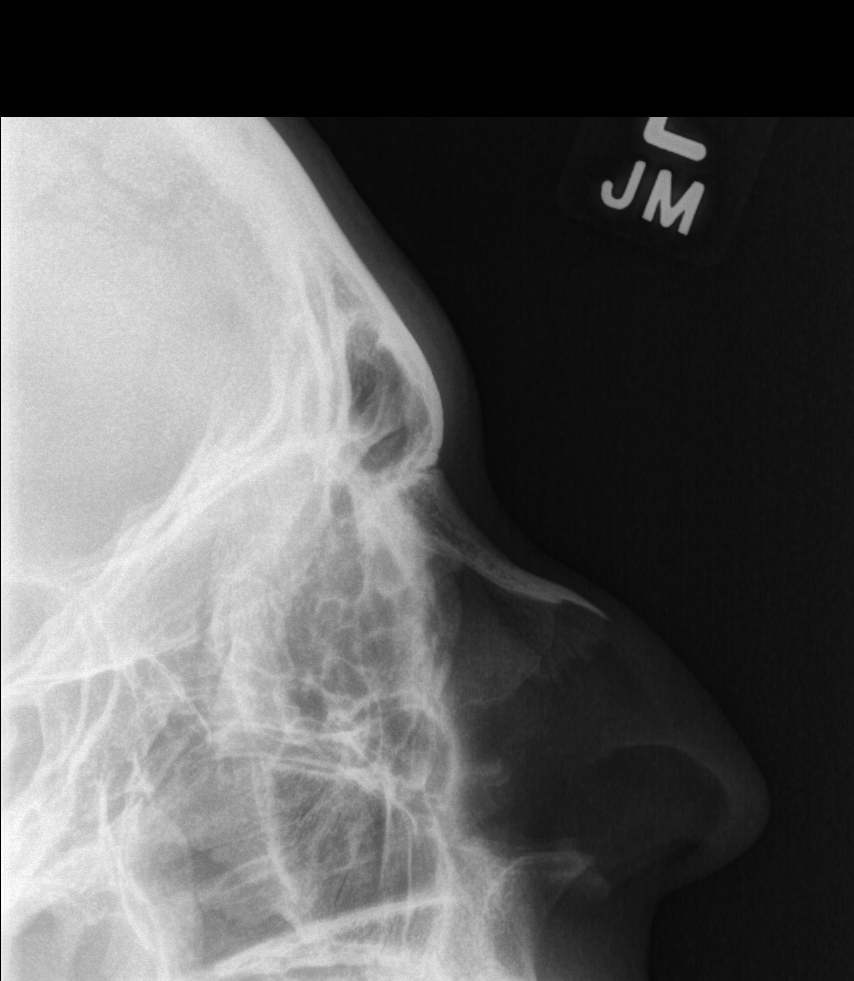

[3 of 3 positions shown; findings below may reference images not displayed]

FINDINGS: There is no evidence of fracture or other bone abnormality.
IMPRESSION: Negative.

## 2022-09-30 ENCOUNTER — Institutional Professional Consult (permissible substitution): Payer: BC Managed Care – PPO | Admitting: Nurse Practitioner
